# Patient Record
Sex: Female | Born: 2000 | Race: Black or African American | Hispanic: No | Marital: Single | Smoking: Never smoker
Health system: Southern US, Community
[De-identification: ages and names within clinical notes are randomized; demographics above are authoritative.]

## PROBLEM LIST (undated history)

## (undated) DIAGNOSIS — J45909 Unspecified asthma, uncomplicated: Secondary | ICD-10-CM

## (undated) DIAGNOSIS — D649 Anemia, unspecified: Secondary | ICD-10-CM

## (undated) HISTORY — PX: MOUTH SURGERY: SHX715

---
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
## 2020-12-13 ENCOUNTER — Other Ambulatory Visit: Payer: Self-pay

## 2020-12-13 ENCOUNTER — Emergency Department (HOSPITAL_BASED_OUTPATIENT_CLINIC_OR_DEPARTMENT_OTHER)
Admission: EM | Admit: 2020-12-13 | Discharge: 2020-12-13 | Disposition: A | Discharge status: Home / Self Care | Payer: BLUE CROSS/BLUE SHIELD | Attending: Student | Admitting: Student

## 2020-12-13 ENCOUNTER — Emergency Department (HOSPITAL_COMMUNITY): Admission: EM | Admit: 2020-12-13 | Discharge: 2020-12-13 | Discharge status: Against Medical Advice | Payer: 59

## 2020-12-13 ENCOUNTER — Emergency Department (HOSPITAL_BASED_OUTPATIENT_CLINIC_OR_DEPARTMENT_OTHER): Payer: BLUE CROSS/BLUE SHIELD

## 2020-12-13 ENCOUNTER — Encounter (HOSPITAL_BASED_OUTPATIENT_CLINIC_OR_DEPARTMENT_OTHER): Payer: Self-pay

## 2020-12-13 DIAGNOSIS — W500XXA Accidental hit or strike by another person, initial encounter: Secondary | ICD-10-CM | POA: Insufficient documentation

## 2020-12-13 DIAGNOSIS — S060X1A Concussion with loss of consciousness of 30 minutes or less, initial encounter: Secondary | ICD-10-CM | POA: Insufficient documentation

## 2020-12-13 DIAGNOSIS — S0990XA Unspecified injury of head, initial encounter: Secondary | ICD-10-CM | POA: Diagnosis present

## 2020-12-13 DIAGNOSIS — Y9367 Activity, basketball: Secondary | ICD-10-CM | POA: Insufficient documentation

## 2020-12-13 NOTE — ED Triage Notes (Addendum)
Pt reports head injury x 2 during basketball game ~530pm-elbow to head x 2-states she did not have LOC-she does have vision changes-"I couldn't walk and I forgot everything"-pt states she was taken to [facility] ED via EMS-states she was given nausea med by EMS-had and IV to right [facility] that was taken out at [facility] ED-[facility]-states she was brought here by her coach and 2 of her friends-NAD-steady gait

## 2020-12-13 NOTE — ED Triage Notes (Signed)
Patient presents to the ED by EMS with c/o possible concussion while playing basketball. Pt was elbowed in the head x2 wobbling. Pt a/o x4 was nauseated c/o headache  113/61 67 NSR  16 resp  99% RA  CBG 127  20 G L AC- 4 mg zofran PTA

## 2020-12-13 NOTE — ED Notes (Signed)
Pt decided to go to a different hospital

## 2020-12-14 NOTE — ED Provider Notes (Signed)
[facility] EMERGENCY DEPARTMENT Provider Note   CSN: [id number] Arrival date & time: 12/13/20  2037     History Chief Complaint  Patient presents with   Head Injury    Marilyn Clark is a 20 y.o. female who presents the emergency department for evaluation of a closed head injury.  Patient states that she has a history of previous concussions.  She states that she was struck in the head with a opposing player's elbow during a basketball game at approximately 530 this evening.  She not have loss of consciousness but did have visual disturbances and had an inability to walk.  She was taken by ambulance to [facility], ED and given Zofran along the way leading to improvement of her nausea.  While in the waiting room, the patient decided that the wait time was too long and her coach and family brought her to [facility] for evaluation.  On arrival to the emergency department, she denies headache, chest pain, shortness of breath, Donnell pain or nausea, vomiting.  Visual symptoms have since resolved and the patient is able to ambulate without difficulty here in the emergency department.   Head Injury Associated symptoms: nausea   Associated symptoms: no seizures and no vomiting       Past Medical History:  Diagnosis Date   Anemia     There are no problems to display for this patient.   History reviewed. No pertinent surgical history.   OB History   No obstetric history on file.     No family history on file.  Social History   Tobacco Use   Smoking status: Never   Smokeless tobacco: Never  Vaping Use   Vaping Use: Never used  Substance Use Topics   Alcohol use: Yes    Comment: occ    Home Medications Prior to Admission medications   Medication Sig Start Date End Date Taking? Authorizing Provider  oseltamivir (TAMIFLU) 75 MG capsule Take 1 capsule (75 mg total) by mouth every 12 (twelve) hours. 12/22/19   Lacretia Leigh, MD    Allergies     Patient has no known allergies.  Review of Systems   Review of Systems  Constitutional:  Negative for chills and fever.  HENT:  Negative for ear pain and sore throat.   Eyes:  Positive for visual disturbance. Negative for pain.  Respiratory:  Negative for cough and shortness of breath.   Cardiovascular:  Negative for chest pain and palpitations.  Gastrointestinal:  Positive for nausea. Negative for abdominal pain and vomiting.  Genitourinary:  Negative for dysuria and hematuria.  Musculoskeletal:  Negative for arthralgias and back pain.  Skin:  Negative for color change and rash.  Neurological:  Positive for weakness. Negative for seizures and syncope.  All other systems reviewed and are negative.  Physical Exam Updated Vital Signs BP 107/79   Pulse (!) 55   Temp 98.1 F (36.7 C) (Oral)   Resp 17   Ht 5\' 7"  (1.702 m)   Wt 77.1 kg   LMP 11/27/2020   SpO2 98%   BMI 26.63 kg/m   Physical Exam Vitals and nursing note reviewed.  Constitutional:      General: She is not in acute distress.    Appearance: She is well-developed.  HENT:     Head: Normocephalic and atraumatic.  Eyes:     Conjunctiva/sclera: Conjunctivae normal.  Cardiovascular:     Rate and Rhythm: Normal rate and regular rhythm.  Heart sounds: No murmur heard. Pulmonary:     Effort: Pulmonary effort is normal. No respiratory distress.     Breath sounds: Normal breath sounds.  Abdominal:     Palpations: Abdomen is soft.     Tenderness: There is no abdominal tenderness.  Musculoskeletal:        General: No swelling.     Cervical back: Neck supple.  Skin:    General: Skin is warm and dry.     Capillary Refill: Capillary refill takes less than 2 seconds.  Neurological:     Mental Status: She is alert.     Cranial Nerves: No cranial nerve deficit.     Sensory: No sensory deficit.     Motor: No weakness.  Psychiatric:        Mood and Affect: Mood normal.    ED Results / Procedures / Treatments    Labs (all labs ordered are listed, but only abnormal results are displayed) Labs Reviewed - No data to display  EKG None  Radiology CT Head Wo Contrast  Result Date: 12/13/2020 CLINICAL DATA:  Head trauma EXAM: CT HEAD WITHOUT CONTRAST TECHNIQUE: Contiguous axial images were obtained from the base of the skull through the vertex without intravenous contrast. COMPARISON:  None. FINDINGS: Brain: There is no mass, hemorrhage or extra-axial collection. The size and configuration of the ventricles and extra-axial CSF spaces are normal. The brain parenchyma is normal, without acute or chronic infarction. Vascular: No abnormal hyperdensity of the major intracranial arteries or dural venous sinuses. No intracranial atherosclerosis. Skull: The visualized skull base, calvarium and extracranial soft tissues are normal. Sinuses/Orbits: No fluid levels or advanced mucosal thickening of the visualized paranasal sinuses. No mastoid or middle ear effusion. The orbits are normal. IMPRESSION: Normal head CT. Electronically Signed   By: Deatra Robinson M.D.   On: 12/13/2020 23:24    Procedures Procedures   Medications Ordered in ED Medications - No data to display  ED Course  I have reviewed the triage vital signs and the nursing notes.  Pertinent labs & imaging results that were available during my care of the patient were reviewed by me and considered in my medical decision making (see chart for details).    MDM Rules/Calculators/A&P                           Patient seen emergency department for evaluation of close head injury.  Physical exam is unremarkable with no focal motor or sensory deficits.  Normal neurologic exam.  CT head obtained due to visual disturbances at time of head injury.  CT head unremarkable.  Patient presentation consistent with close head injury and concussion and she will require outpatient sports medicine follow-up for return to play guidelines.  Patient does not have a primary  care physician in [place] and is traveling home to [place] in 2 weeks.  She was instructed not to return to play unless cleared by a physician. Final Clinical Impression(s) / ED Diagnoses Final diagnoses:  Concussion with loss of consciousness of 30 minutes or less, initial encounter    Rx / DC Orders ED Discharge Orders     None        Anneth Brunell, Wyn Forster, MD 12/14/20 [phone]

## 2020-12-18 ENCOUNTER — Ambulatory Visit (INDEPENDENT_AMBULATORY_CARE_PROVIDER_SITE_OTHER): Payer: BLUE CROSS/BLUE SHIELD | Admitting: Family Medicine

## 2020-12-18 VITALS — BP 110/70 | Ht 67.0 in | Wt 165.0 lb

## 2020-12-18 DIAGNOSIS — S060X0A Concussion without loss of consciousness, initial encounter: Secondary | ICD-10-CM

## 2020-12-18 NOTE — Patient Instructions (Signed)
Nice to meet you Please be return slowly back to your activities   Please send me a message in MyChart with any questions or updates.  Please see me back as needed.   --Dr. Jordan Likes

## 2020-12-18 NOTE — Progress Notes (Signed)
  Marilyn Clark - 20 y.o. female MRN [id number]  Date of birth: 08/08/2000  SUBJECTIVE:  Including CC & ROS.  No chief complaint on file.   Marilyn Clark is a 20 y.o. female that is presenting with concussion.  She was playing basketball and was hit in the side of the head.  She was evaluated emergency department.  She is a history of concussion.  She denies any symptoms for the past 4 days.  She has been able to attend her classes..   Review of Systems See HPI   HISTORY: Past Medical, Surgical, Social, and Family History Reviewed & Updated per EMR.   Pertinent Historical Findings include:  Past Medical History:  Diagnosis Date   Anemia     No past surgical history on file.  No family history on file.  Social History   Socioeconomic History   Marital status: Single    Spouse name: Not on file   Number of children: Not on file   Years of education: Not on file   Highest education level: Not on file  Occupational History   Not on file  Tobacco Use   Smoking status: Never   Smokeless tobacco: Never  Vaping Use   Vaping Use: Never used  Substance and Sexual Activity   Alcohol use: Yes    Comment: occ   Drug use: Not on file   Sexual activity: Not on file  Other Topics Concern   Not on file  Social History Narrative   Not on file   Social Determinants of Health   Financial Resource Strain: Not on file  Food Insecurity: Not on file  Transportation Needs: Not on file  Physical Activity: Not on file  Stress: Not on file  Social Connections: Not on file  Intimate Partner Violence: Not on file     PHYSICAL EXAM:  VS: BP 110/70   Ht 5\' 7"  (1.702 m)   Wt 165 lb (74.8 kg)   LMP 11/27/2020   BMI 25.84 kg/m  Physical Exam Gen: NAD, alert, cooperative with exam, well-appearing     ASSESSMENT & PLAN:   Concussion with no loss of consciousness Has had resolution of her symptoms.  Has a history of previous concussion.  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 -Counseled  on home exercise therapy and supportive care. -Counseled on gradual return to play. -Could consider physical therapy.

## 2020-12-18 NOTE — Assessment & Plan Note (Signed)
Has had resolution of her symptoms.  Has a history of previous concussion.  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 -Counseled on home exercise therapy and supportive care. -Counseled on gradual return to play. -Could consider physical therapy.

## 2021-01-26 ENCOUNTER — Encounter (HOSPITAL_BASED_OUTPATIENT_CLINIC_OR_DEPARTMENT_OTHER): Payer: Self-pay | Admitting: *Deleted

## 2021-01-26 ENCOUNTER — Emergency Department (HOSPITAL_BASED_OUTPATIENT_CLINIC_OR_DEPARTMENT_OTHER)
Admission: EM | Admit: 2021-01-26 | Discharge: 2021-01-27 | Disposition: A | Discharge status: Home / Self Care | Payer: BLUE CROSS/BLUE SHIELD | Attending: Emergency Medicine | Admitting: Emergency Medicine

## 2021-01-26 ENCOUNTER — Other Ambulatory Visit: Payer: Self-pay

## 2021-01-26 DIAGNOSIS — R112 Nausea with vomiting, unspecified: Secondary | ICD-10-CM | POA: Insufficient documentation

## 2021-01-26 DIAGNOSIS — R531 Weakness: Secondary | ICD-10-CM | POA: Diagnosis not present

## 2021-01-26 DIAGNOSIS — R1011 Right upper quadrant pain: Secondary | ICD-10-CM | POA: Diagnosis not present

## 2021-01-26 DIAGNOSIS — R55 Syncope and collapse: Secondary | ICD-10-CM | POA: Insufficient documentation

## 2021-01-26 DIAGNOSIS — R42 Dizziness and giddiness: Secondary | ICD-10-CM | POA: Insufficient documentation

## 2021-01-26 DIAGNOSIS — R110 Nausea: Secondary | ICD-10-CM

## 2021-01-26 LAB — CBC WITH DIFFERENTIAL/PLATELET
Abs Immature Granulocytes: 0.02 10*3/uL (ref 0.00–0.07)
Basophils Absolute: 0 10*3/uL (ref 0.0–0.1)
Basophils Relative: 0 %
Eosinophils Absolute: 0 10*3/uL (ref 0.0–0.5)
Eosinophils Relative: 0 %
HCT: 41.9 % (ref 36.0–46.0)
Hemoglobin: 13.9 g/dL (ref 12.0–15.0)
Immature Granulocytes: 0 %
Lymphocytes Relative: 28 %
Lymphs Abs: 2.9 10*3/uL (ref 0.7–4.0)
MCH: 29.5 pg (ref 26.0–34.0)
MCHC: 33.2 g/dL (ref 30.0–36.0)
MCV: 89 fL (ref 80.0–100.0)
Monocytes Absolute: 0.5 10*3/uL (ref 0.1–1.0)
Monocytes Relative: 4 %
Neutro Abs: 7 10*3/uL (ref 1.7–7.7)
Neutrophils Relative %: 68 %
Platelets: 227 10*3/uL (ref 150–400)
RBC: 4.71 MIL/uL (ref 3.87–5.11)
RDW: 15.1 % (ref 11.5–15.5)
WBC: 10.4 10*3/uL (ref 4.0–10.5)
nRBC: 0 % (ref 0.0–0.2)

## 2021-01-26 LAB — COMPREHENSIVE METABOLIC PANEL
ALT: 26 U/L (ref 0–44)
AST: 37 U/L (ref 15–41)
Albumin: 5 g/dL (ref 3.5–5.0)
Alkaline Phosphatase: 114 U/L (ref 38–126)
Anion gap: 13 (ref 5–15)
BUN: 15 mg/dL (ref 6–20)
CO2: 22 mmol/L (ref 22–32)
Calcium: 10.3 mg/dL (ref 8.9–10.3)
Chloride: 101 mmol/L (ref 98–111)
Creatinine, Ser: 0.81 mg/dL (ref 0.44–1.00)
GFR, Estimated: >60 mL/min (ref >60)
Glucose, Bld: 92 mg/dL (ref 70–99)
Potassium: 3.6 mmol/L (ref 3.5–5.1)
Sodium: 136 mmol/L (ref 135–145)
Total Bilirubin: 0.8 mg/dL (ref 0.3–1.2)
Total Protein: 9.4 g/dL — ABNORMAL HIGH (ref 6.5–8.1)

## 2021-01-26 LAB — TROPONIN I (HIGH SENSITIVITY): Troponin I (High Sensitivity): 4 ng/L (ref <18)

## 2021-01-26 MED ORDER — ONDANSETRON 4 MG PO TBDP
4.0000 mg | ORAL_TABLET | Freq: Once | ORAL | Status: AC
  Administered 2021-01-26: 4 mg via ORAL

## 2021-01-26 MED ORDER — ONDANSETRON 4 MG PO TBDP
ORAL_TABLET | ORAL | Status: AC
  Filled 2021-01-26: qty 1

## 2021-01-26 NOTE — ED Triage Notes (Addendum)
C/o dizziness  and palpitations and " felt light like I was going to pass out " during work out today , pt states she took a pre work out power prior to work out

## 2021-01-27 ENCOUNTER — Emergency Department (HOSPITAL_BASED_OUTPATIENT_CLINIC_OR_DEPARTMENT_OTHER)
Admission: RE | Admit: 2021-01-27 | Discharge: 2021-01-27 | Disposition: A | Discharge status: Home / Self Care | Payer: BLUE CROSS/BLUE SHIELD | Source: Ambulatory Visit | Attending: Emergency Medicine | Admitting: Emergency Medicine

## 2021-01-27 DIAGNOSIS — R1011 Right upper quadrant pain: Secondary | ICD-10-CM | POA: Insufficient documentation

## 2021-01-27 DIAGNOSIS — R112 Nausea with vomiting, unspecified: Secondary | ICD-10-CM | POA: Insufficient documentation

## 2021-01-27 NOTE — ED Provider Notes (Signed)
[facility] EMERGENCY DEPARTMENT Provider Note   CSN: [id number] Arrival date & time: 01/26/21  2153     History  Chief Complaint  Patient presents with   Dizziness    Marilyn Clark is a 21 y.o. female.  Patient is a 21 year old female with no significant past medical history.  Patient presenting today for evaluation of dizziness and near syncope.  Patient plays basketball for a local college and states that before her workout today drank which she calls a "pre-workout".  It sounds as though this is a energy drink from what she tells me.  While practicing, she began to feel lightheaded and as if her "body was shutting down".  She felt nauseated, but did not vomit.  She denies any recent symptoms such as vomiting or diarrhea.  She denies abdominal pain.  She denies fevers or chills.  Patient tells me she was advised to see a rheumatologist for various issues she was experiencing at home prior to coming here for school, however this has not happened.  The history is provided by the patient.  Dizziness Quality:  Lightheadedness Severity:  Moderate Onset quality:  Sudden Duration:  2 hours Timing:  Constant Progression:  Partially resolved Chronicity:  New Relieved by:  Nothing Worsened by:  Nothing Ineffective treatments:  None tried     Home Medications Prior to Admission medications   Medication Sig Start Date End Date Taking? Authorizing Provider  oseltamivir (TAMIFLU) 75 MG capsule Take 1 capsule (75 mg total) by mouth every 12 (twelve) hours. 12/22/19   Lorre Nick, MD      Allergies    Patient has no known allergies.    Review of Systems   Review of Systems  Neurological:  Positive for dizziness.  All other systems reviewed and are negative.  Physical Exam Updated Vital Signs BP 117/78 (BP Location: Right Arm)    Pulse (!) 59    Resp 16    Ht 5\' 7"  (1.702 m)    Wt 74.8 kg    LMP 01/20/2021    SpO2 100%    BMI 25.84 kg/m  Physical Exam Vitals and  nursing note reviewed.  Constitutional:      General: She is not in acute distress.    Appearance: She is well-developed. She is not diaphoretic.  HENT:     Head: Normocephalic and atraumatic.  Cardiovascular:     Rate and Rhythm: Normal rate and regular rhythm.     Heart sounds: No murmur heard.   No friction rub. No gallop.  Pulmonary:     Effort: Pulmonary effort is normal. No respiratory distress.     Breath sounds: Normal breath sounds. No wheezing.  Abdominal:     General: Bowel sounds are normal. There is no distension.     Palpations: Abdomen is soft.     Tenderness: There is no abdominal tenderness.  Musculoskeletal:        General: Normal range of motion.     Cervical back: Normal range of motion and neck supple.  Skin:    General: Skin is warm and dry.  Neurological:     General: No focal deficit present.     Mental Status: She is alert and oriented to person, place, and time.    ED Results / Procedures / Treatments   Labs (all labs ordered are listed, but only abnormal results are displayed) Labs Reviewed  COMPREHENSIVE METABOLIC PANEL - Abnormal; Notable for the following components:  Result Value   Total Protein 9.4 (*)    All other components within normal limits  CBC WITH DIFFERENTIAL/PLATELET  TROPONIN I (HIGH SENSITIVITY)    EKG EKG Interpretation  Date/Time:  Friday January 26 2021 22:07:17 EST Ventricular Rate:  61 PR Interval:  166 QRS Duration: 82 QT Interval:  426 QTC Calculation: 428 R Axis:   99 Text Interpretation: Normal sinus rhythm with sinus arrhythmia Rightward axis Nonspecific ST abnormality Abnormal ECG No previous ECGs available Confirmed by Meridee Score [phone]) on 01/26/2021 10:11:23 PM  Radiology No results found.  Procedures Procedures    Medications Ordered in ED Medications  ondansetron (ZOFRAN-ODT) disintegrating tablet 4 mg (4 mg Oral Not Given 01/26/21 2320)    ED Course/ Medical Decision Making/  A&P  Patient is a 21 year old female presenting here with weakness and near syncope as above.  She arrives here with stable vital signs.  EKG shows sinus rhythm with no acute abnormality and laboratory studies have returned as unremarkable.  Her hemoglobin is 13.9 and blood sugar is normal.  At this point, feels the patient can safely be discharged.  She has inquired about seeing a rheumatologist locally and I will give her the contact information for rheumatology in [place].  Her mother was on the phone during the exam and questions whether her gallbladder could be a cause.  I will order an outpatient ultrasound as they have already left for the day.  She is to return in the meantime if symptoms worsen or change.  Patient advised to avoid energy drinks and caffeine for the immediate future.  Final Clinical Impression(s) / ED Diagnoses Final diagnoses:  None    Rx / DC Orders ED Discharge Orders     None         Geoffery Lyons, MD 01/27/21 [phone]

## 2021-01-27 NOTE — Discharge Instructions (Signed)
Drink plenty of fluids and get plenty of rest.  Avoid energy drinks and excessive caffeine.  Follow-up with rheumatology.  The contact information for [facility] rheumatology has been provided in this discharge summary for you to call and make these arrangements.  Return for an ultrasound of your gallbladder at the given time.  Return to the emergency department if symptoms significantly worsen or change.

## 2021-01-27 NOTE — ED Provider Notes (Signed)
Patient seen today for outpatient ultrasound of gallbladder.  She was seen yesterday in the ED for dizziness, nausea and vomiting.  The ultrasound was reviewed, no evidence of cholecystitis.  Discussed with patient.  She has follow-up plan in place from visit last night.  She was discharged in stable condition.   Theron Arista, PA-C 01/27/21 1509    Terrilee Files, MD 01/28/21 1016

## 2021-04-30 ENCOUNTER — Emergency Department (INDEPENDENT_AMBULATORY_CARE_PROVIDER_SITE_OTHER): Payer: Self-pay

## 2021-04-30 ENCOUNTER — Emergency Department (INDEPENDENT_AMBULATORY_CARE_PROVIDER_SITE_OTHER)
Admission: EM | Admit: 2021-04-30 | Discharge: 2021-04-30 | Disposition: A | Discharge status: Home / Self Care | Payer: Self-pay | Source: Home / Self Care | Attending: Family Medicine | Admitting: Family Medicine

## 2021-04-30 DIAGNOSIS — M79662 Pain in left lower leg: Secondary | ICD-10-CM | POA: Diagnosis not present

## 2021-04-30 DIAGNOSIS — S83002S Unspecified subluxation of left patella, sequela: Secondary | ICD-10-CM | POA: Diagnosis not present

## 2021-04-30 DIAGNOSIS — M25562 Pain in left knee: Secondary | ICD-10-CM

## 2021-04-30 DIAGNOSIS — S8012XA Contusion of left lower leg, initial encounter: Secondary | ICD-10-CM | POA: Diagnosis not present

## 2021-04-30 HISTORY — DX: Unspecified asthma, uncomplicated: J45.909

## 2021-04-30 NOTE — ED Triage Notes (Signed)
Pt presents to Urgent Care with c/o L leg pain following injury on 04/26/21. Reports pain to L kneecap and also L medial calf after dropping a box on her leg last week at work.  ?

## 2021-04-30 NOTE — Discharge Instructions (Addendum)
Wear knee brace for 10 to 14 days.  Apply ice pack for 20 to 30 minutes, 3 to 4 times daily  Continue until pain and swelling decrease.  Wear ace wrap lightly on left lower leg. ?Begin knee exercises as tolerated. ?Take Ibuprofen 200mg , 4 tabs every 8 hours with food.  ?Return tomorrow for ultrasound of left leg. ?

## 2021-04-30 NOTE — ED Provider Notes (Signed)
?[facility] ? ? ? ?CSN: [id number] ?Arrival date & time: 04/30/21  1926 ? ? ?  ? ?History   ?Chief Complaint ?Chief Complaint  ?Patient presents with  ? Leg Pain  ? Injury  ? ? ?HPI ?Marilyn Clark is a 21 y.o. female.  ? ?While lifting boxes at work four days ago, a 60 pound box at the top of a stack fell against her left lateral knee and lower leg.  She states that her left patella was dislocated medially and stayed that way for 2 days before spontaneously reducing.  She reports that she still has mild pain in her left knee but the swelling has decreased.  She complains of persistent pain and mild swelling in her left lower leg above the ankle and her left calf.  She denies history of DVT.  She denies chest pain and shortness of breath. ? ?The history is provided by the patient.  ?Leg Pain ?Location:  Knee and leg ?Time since incident:  4 days ?Injury: yes   ?Leg location:  L lower leg ?Knee location:  L knee ?Pain details:  ?  Quality:  Aching and dull ?  Severity:  Mild ?  Onset quality:  Sudden ?  Duration:  1 day ?  Timing:  Constant ?  Progression:  Unchanged ?Chronicity:  New ?Prior injury to area:  No ?Relieved by:  Nothing ?Worsened by:  Bearing weight and activity ?Ineffective treatments:  Ice ?Associated symptoms: decreased ROM and swelling   ?Associated symptoms: no muscle weakness, no numbness and no tingling   ? ?Past Medical History:  ?Diagnosis Date  ? Anemia   ? Asthma   ? ? ?Patient Active Problem List  ? Diagnosis Date Noted  ? Concussion with no loss of consciousness 12/18/2020  ? ? ?Past Surgical History:  ?Procedure Laterality Date  ? MOUTH SURGERY    ? ? ?OB History   ?No obstetric history on file. ?  ? ? ? ?Home Medications   ? ?Prior to Admission medications   ?Medication Sig Start Date End Date Taking? Authorizing Provider  ?albuterol (VENTOLIN HFA) 108 (90 Base) MCG/ACT inhaler Inhale into the lungs. 08/18/20  Yes [provider]  ?fluticasone-salmeterol (ADVAIR  DISKUS) 100-50 MCG/ACT AEPB INHALE 1 PUFF BID 06/16/15  Yes [provider]  ?ibuprofen (ADVIL) 400 MG tablet Take 400 mg by mouth every 6 (six) hours as needed.   Yes [provider]  ?oseltamivir (TAMIFLU) 75 MG capsule Take 1 capsule (75 mg total) by mouth every 12 (twelve) hours. 12/22/19   Lorre NickAllen, Anthony, MD  ? ? ?Family History ?Family History  ?Problem Relation Age of Onset  ? Arthritis Mother   ? Heart Problems Father   ? ? ?Social History ?Social History  ? ?Tobacco Use  ? Smoking status: Never  ? Smokeless tobacco: Never  ?Vaping Use  ? Vaping Use: Never used  ?Substance Use Topics  ? Alcohol use: Yes  ?  Comment: occ  ? Drug use: Not Currently  ? ? ? ?Allergies   ?Patient has no known allergies. ? ? ?Review of Systems ?Review of Systems  ?Constitutional: Negative.   ?HENT: Negative.    ?Eyes: Negative.   ?Respiratory:  Negative for cough, chest tightness, shortness of breath and stridor.   ?Cardiovascular:  Negative for chest pain.  ?Gastrointestinal: Negative.   ?Genitourinary: Negative.   ?Musculoskeletal:   ?     Left knee and lower leg pain.  ?Skin: Negative.   ?Neurological:  Negative.   ? ? ?Physical Exam ?Triage Vital Signs ?ED Triage Vitals  ?Enc Vitals Group  ?   BP 04/30/21 2043 112/76  ?   Pulse Rate 04/30/21 2043 67  ?   Resp 04/30/21 2043 20  ?   Temp 04/30/21 2043 98.2 ?F (36.8 ?C)  ?   Temp Source 04/30/21 2043 Oral  ?   SpO2 04/30/21 2043 100 %  ?   Weight 04/30/21 2039 165 lb (74.8 kg)  ?   Height 04/30/21 2039 5\' 7"  (1.702 m)  ?   Head Circumference --   ?   Peak Flow --   ?   Pain Score 04/30/21 2038 6  ?   Pain Loc --   ?   Pain Edu? --   ?   Excl. in GC? --   ? ?No data found. ? ?Updated Vital Signs ?BP 112/76 (BP Location: Left Arm)   Pulse 67   Temp 98.2 ?F (36.8 ?C) (Oral)   Resp 20   Ht 5\' 7"  (1.702 m)   Wt 74.8 kg   LMP 04/17/2021 (Exact Date)   SpO2 100%   BMI 25.84 kg/m?  ? ?Visual Acuity ?Right Eye Distance:   ?Left Eye Distance:   ?Bilateral Distance:    ? ?Right Eye Near:   ?Left Eye Near:    ?Bilateral Near:    ? ?Physical Exam ?Vitals and nursing note reviewed.  ?Constitutional:   ?   General: She is not in acute distress. ?HENT:  ?   Head: Atraumatic.  ?Eyes:  ?   Pupils: Pupils are equal, round, and reactive to light.  ?Cardiovascular:  ?   Rate and Rhythm: Normal rate and regular rhythm.  ?   Heart sounds: Normal heart sounds.  ?Pulmonary:  ?   Breath sounds: Normal breath sounds.  ?Musculoskeletal:  ?   Cervical back: Normal range of motion.  ?   Left knee: No swelling, deformity, effusion, erythema, ecchymosis or crepitus. Normal range of motion. Tenderness present. Normal meniscus and normal patellar mobility.  ?   Instability Tests: Anterior drawer test negative. Posterior drawer test negative. Medial McMurray test negative and lateral McMurray test negative.  ?     Legs: ? ?   Comments: Left lower leg has tenderness to palpation over anterior compartments and posterior calf as noted on diagram.  Homan's test positive. ? ?Left knee has mild tenderness with manipulation of patella. ?   ?Skin: ?   General: Skin is warm and dry.  ?   Findings: No erythema.  ?Neurological:  ?   General: No focal deficit present.  ?   Mental Status: She is alert.  ? ? ? ?UC Treatments / Results  ?Labs ?(all labs ordered are listed, but only abnormal results are displayed) ?Labs Reviewed - No data to display ? ?EKG ? ? ?Radiology ?DG Tibia/Fibula Left ? ?Result Date: 04/30/2021 ?CLINICAL DATA:  Trauma to the left lower extremity. EXAM: LEFT KNEE - COMPLETE 4+ VIEW; LEFT TIBIA AND FIBULA - 2 VIEW COMPARISON:  None. FINDINGS: No evidence of fracture, dislocation, or joint effusion. No evidence of arthropathy or other focal bone abnormality. Soft tissues are unremarkable. IMPRESSION: Negative. Electronically Signed   By: 06/17/2021 M.D.   On: 04/30/2021 20:39  ? ?DG Knee Complete 4 Views Left ? ?Result Date: 04/30/2021 ?CLINICAL DATA:  Trauma to the left lower extremity. EXAM:  LEFT KNEE - COMPLETE 4+ VIEW; LEFT TIBIA AND FIBULA - 2 VIEW COMPARISON:  None. FINDINGS: No evidence of fracture, dislocation, or joint effusion. No evidence of arthropathy or other focal bone abnormality. Soft tissues are unremarkable. IMPRESSION: Negative. Electronically Signed   By: Elgie Collard M.D.   On: 04/30/2021 20:39   ? ?Procedures ?Procedures (including critical care time) ? ?Medications Ordered in UC ?Medications - No data to display ? ?Initial Impression / Assessment and Plan / UC Course  ?I have reviewed the triage vital signs and the nursing notes. ? ?Pertinent labs & imaging results that were available during my care of the patient were reviewed by me and considered in my medical decision making (see chart for details). ? ?  ?By history, patient subluxed her left patella, now spontaneously reduced.  Applied hinged knee brace. ?Concern for possible DVT left lower leg.  Advised patient to return tomorrow early afternoon for venous US left leg. ? ?Final Clinical Impressions(s) / UC Diagnoses  ? ?Final diagnoses:  ?Contusion of left lower leg, initial encounter  ?Patellar subluxation, left, sequela  ? ? ? ?Discharge Instructions   ? ?  ?Wear knee brace for 10 to 14 days.  Apply ice pack for 20 to 30 minutes, 3 to 4 times daily  Continue until pain and swelling decrease.  Wear ace wrap lightly on left lower leg. ?Begin knee exercises as tolerated. ?Take Ibuprofen 200mg , 4 tabs every 8 hours with food.  ?Return tomorrow for ultrasound of left leg. ? ? ? ?ED Prescriptions   ?None ?  ? ? ?  ? , MD ?05/01/21 1413 ? ?

## 2022-04-29 ENCOUNTER — Encounter: Payer: Self-pay | Admitting: *Deleted

## 2023-04-19 IMAGING — US US ABDOMEN LIMITED
1 series · 14 of 25 positions shown · non-contrast
Comparison: None.

CLINICAL DATA: Right upper quadrant abdominal pain, nausea vomiting
for 2 days.

EXAM:
ULTRASOUND ABDOMEN LIMITED RIGHT UPPER QUADRANT

[Series 1: us abdomen limited · 14 of 80 slices shown]
[im 1/80]
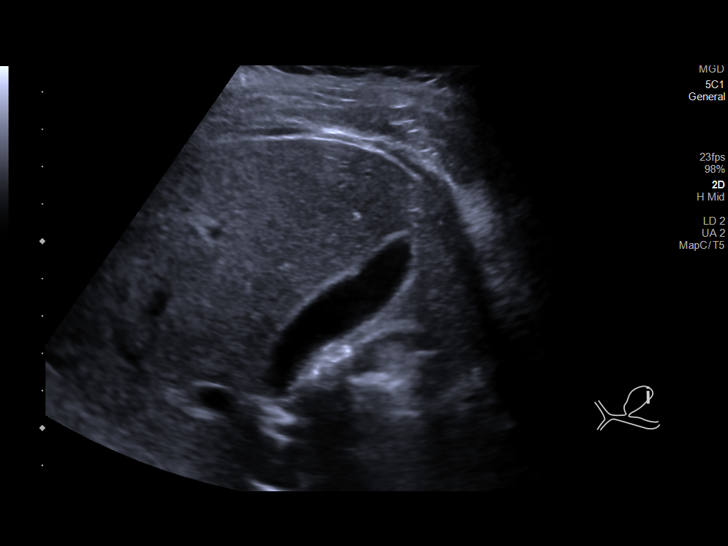
[im 7/80]
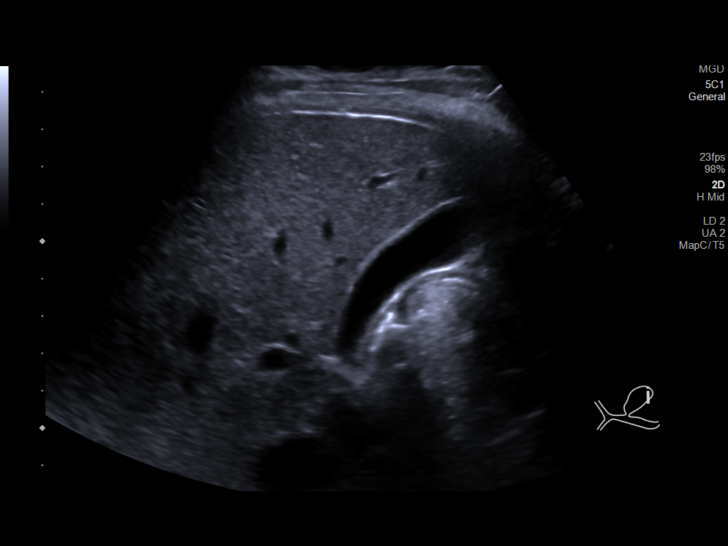
[im 14/80]
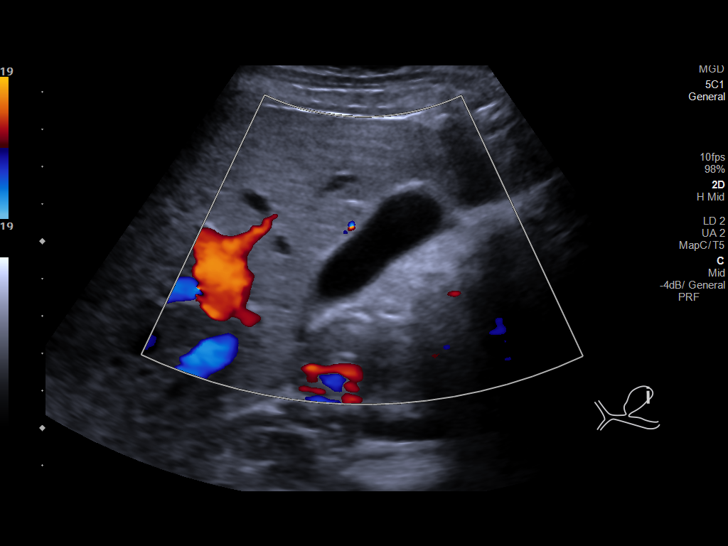
[im 20/80]
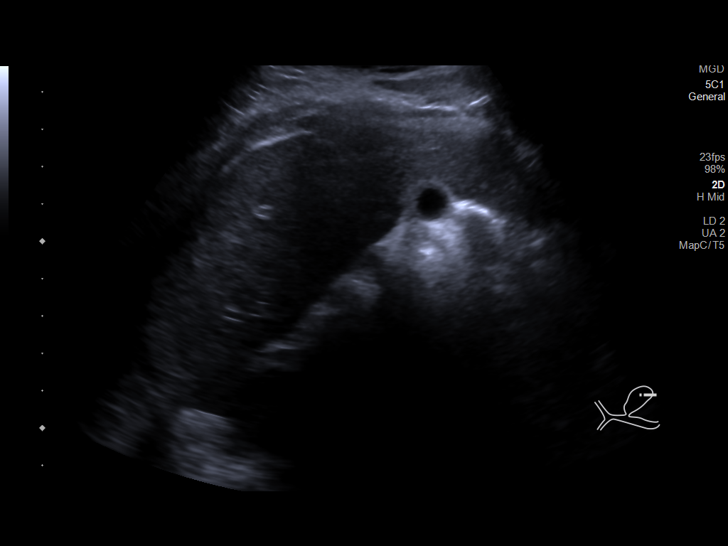
[im 27/80]
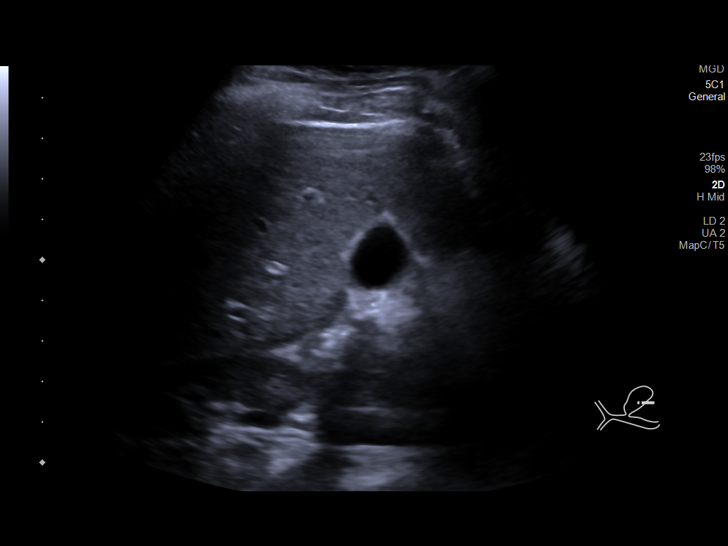
[im 30/80]
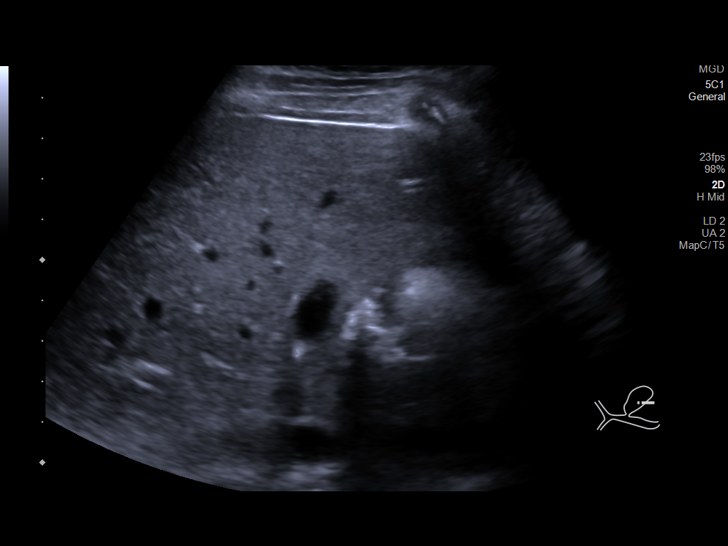
[im 37/80]
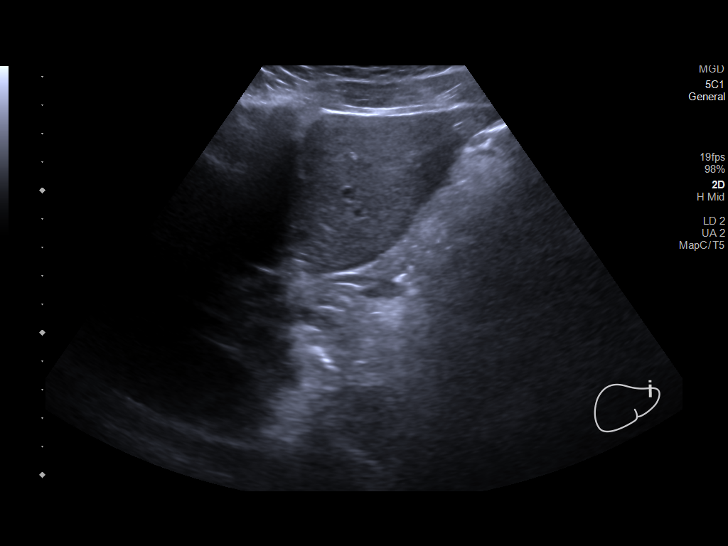
[im 43/80]
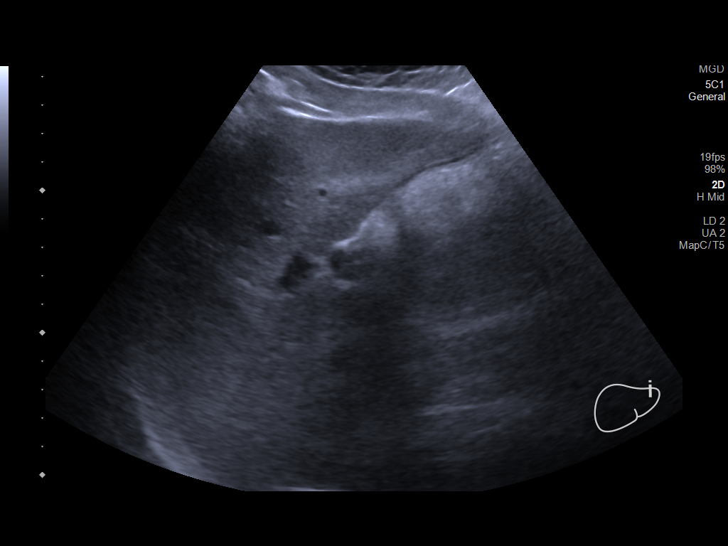
[im 50/80]
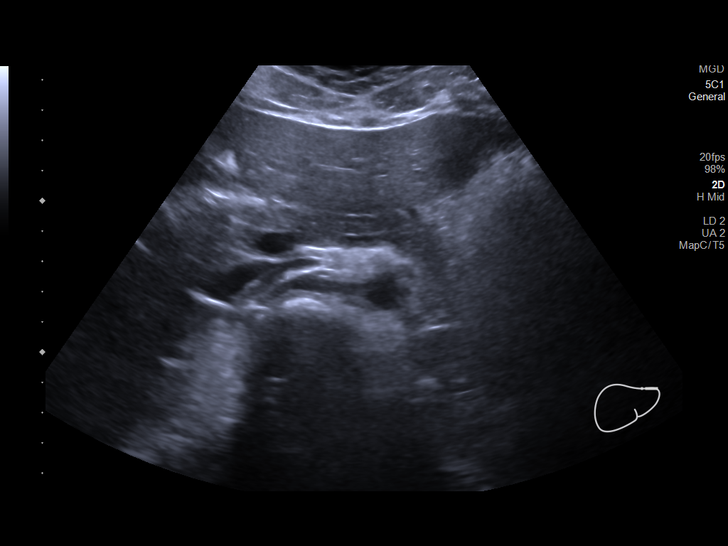
[im 53/80]
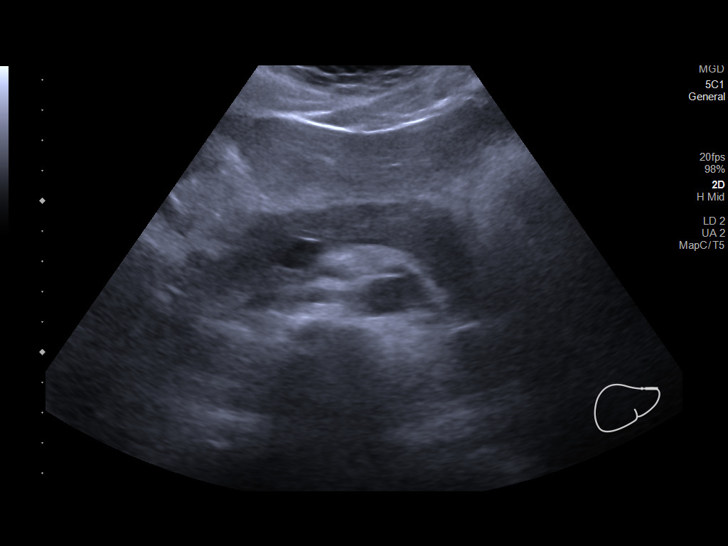
[im 60/80]
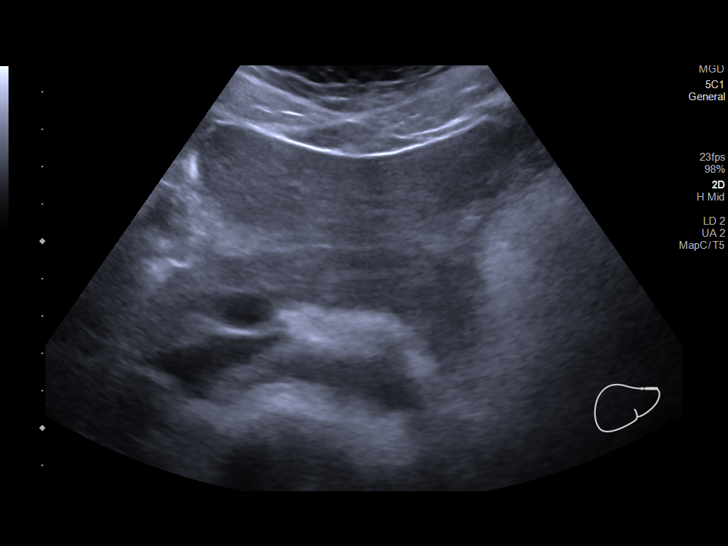
[im 66/80]
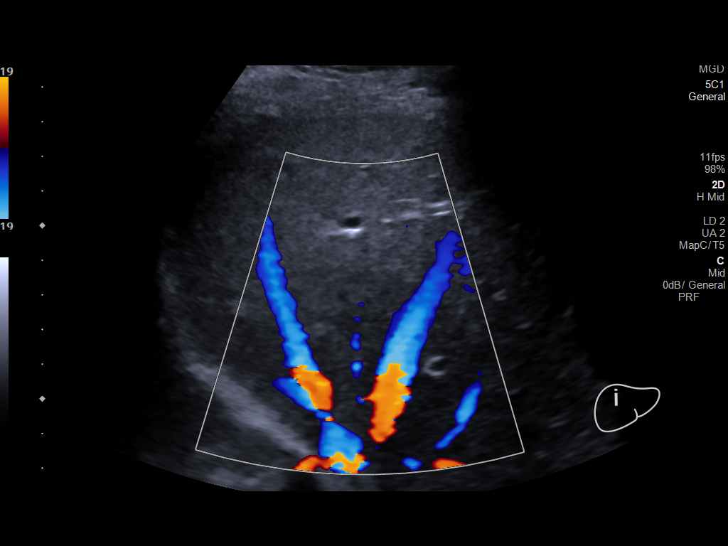
[im 73/80]
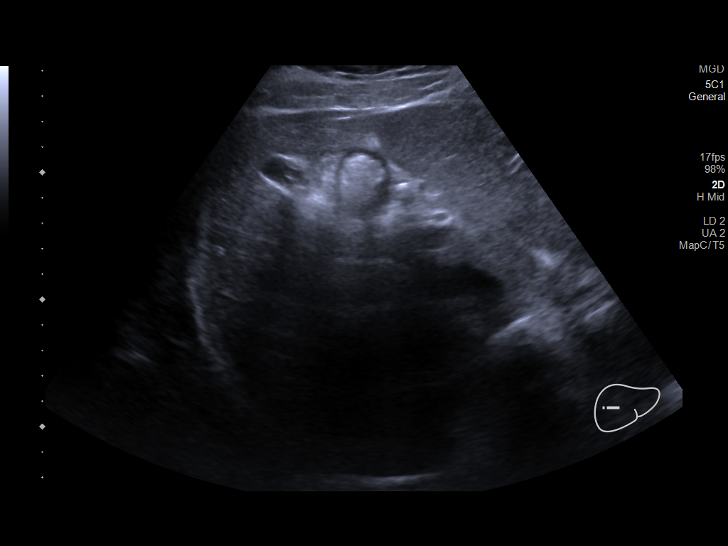
[im 80/80]
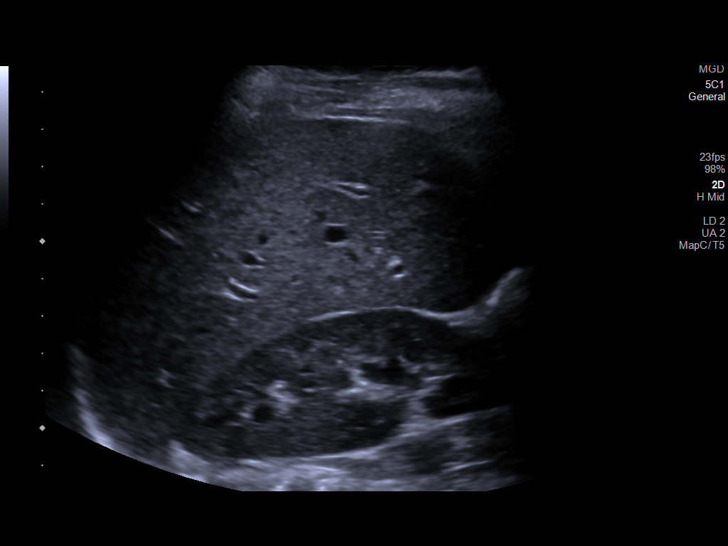

[14 of 25 positions shown; findings below may reference images not displayed]

FINDINGS: Gallbladder:

No gallstones or wall thickening visualized. No sonographic Murphy
sign noted by sonographer.

Common bile duct:

Diameter: 3 mm

Liver:

No focal lesion identified. Within normal limits in parenchymal
echogenicity. Portal vein is patent on color Doppler imaging with
normal direction of blood flow towards the liver.

Other: None.
IMPRESSION: Normal right upper quadrant ultrasound.
